# Patient Record
Sex: Male | Born: 2000 | Race: Black or African American | Hispanic: No | Marital: Single | Smoking: Never smoker
Health system: Southern US, Community
[De-identification: ages and names within clinical notes are randomized; demographics above are authoritative.]

## PROBLEM LIST (undated history)

## (undated) DIAGNOSIS — IMO0001 Reserved for inherently not codable concepts without codable children: Secondary | ICD-10-CM

## (undated) DIAGNOSIS — J45909 Unspecified asthma, uncomplicated: Secondary | ICD-10-CM

## (undated) DIAGNOSIS — D573 Sickle-cell trait: Secondary | ICD-10-CM

## (undated) DIAGNOSIS — Z464 Encounter for fitting and adjustment of orthodontic device: Secondary | ICD-10-CM

## (undated) HISTORY — PX: NO PAST SURGERIES: SHX2092

---
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
## 2022-05-27 IMAGING — US US SCROTUM W/ DOPPLER COMPLETE
1 series · 13 of 25 positions shown · non-contrast
Comparison: None.

CLINICAL DATA: Right testicular pain x3 hours

EXAM:
SCROTAL ULTRASOUND
DOPPLER ULTRASOUND OF THE TESTICLES
TECHNIQUE: Complete ultrasound examination of the testicles, epididymis, and
other scrotal structures was performed. Color and spectral Doppler
ultrasound were also utilized to evaluate blood flow to the
testicles.

[Series 1: us scrotum w/doppler · 54 acquisitions, 13 frames shown]
[im 1/54]
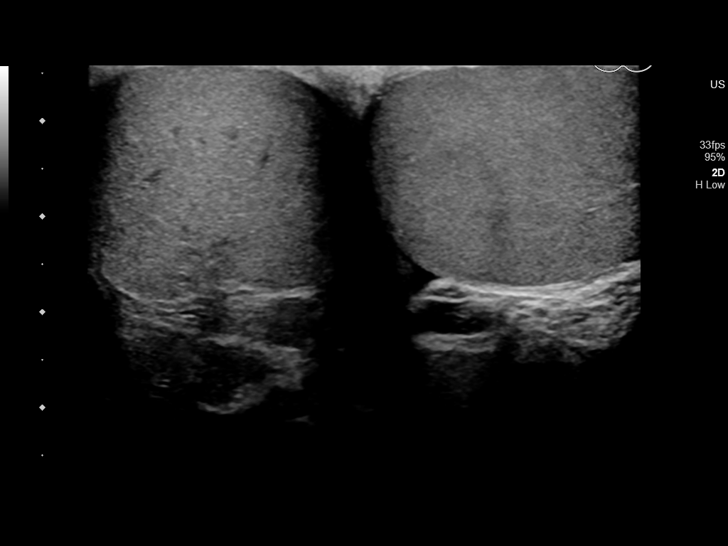
[im 5/54]
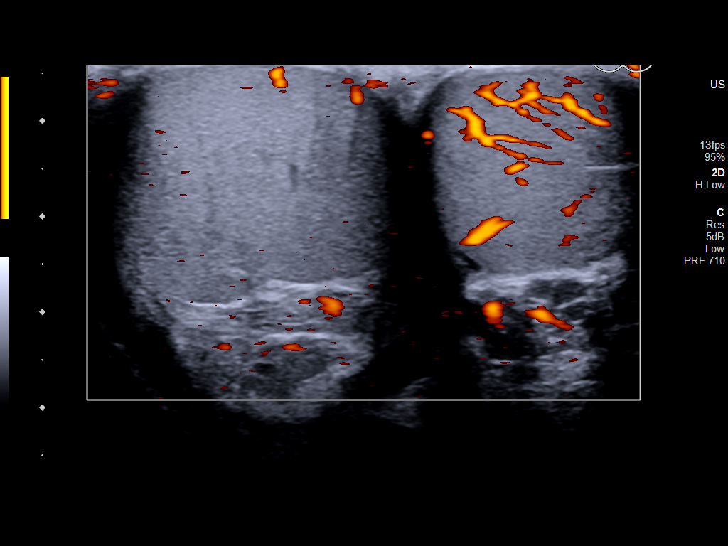
[im 9/54]
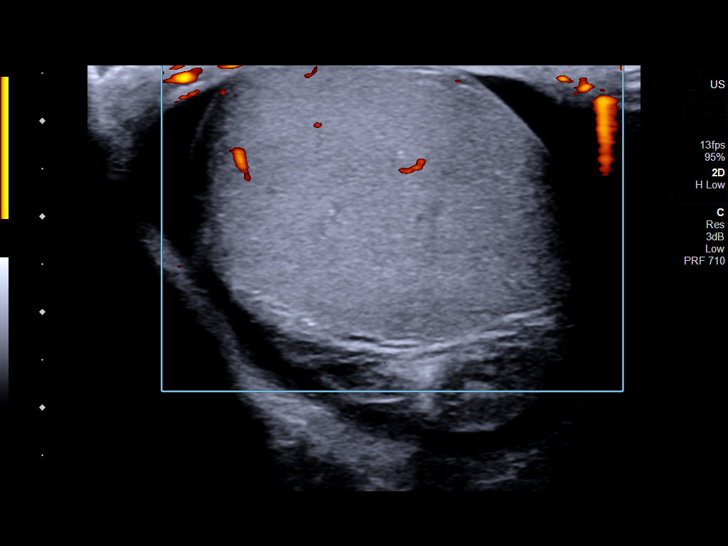
[im 14/54]
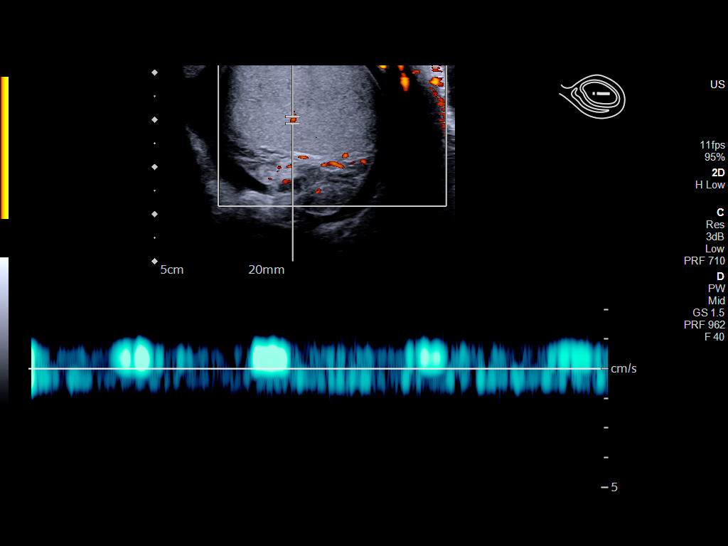
[im 18/54]
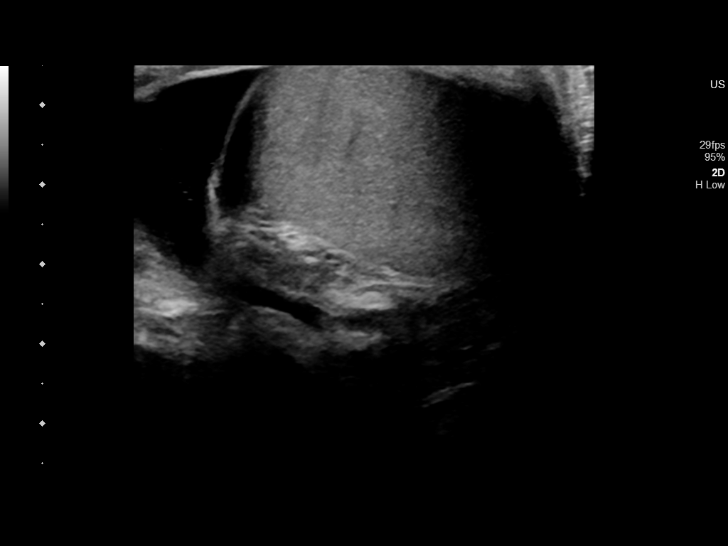
[im 23/54]
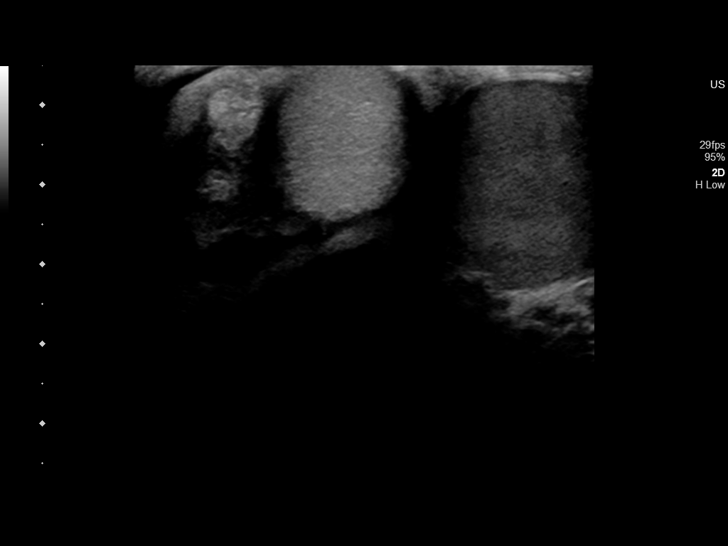
[im 27/54]
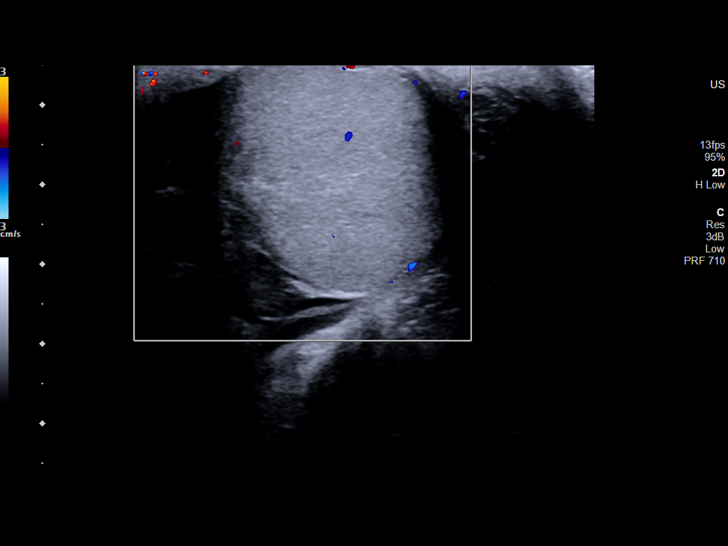
[im 31/54]
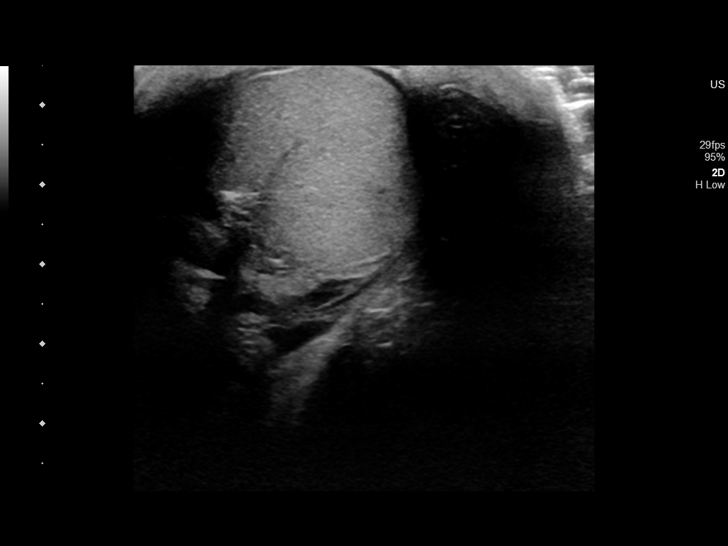
[im 36/54]
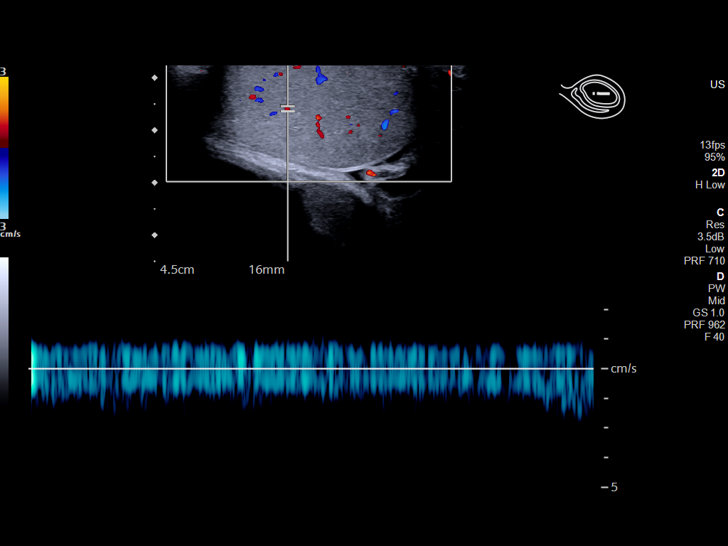
[im 40/54]
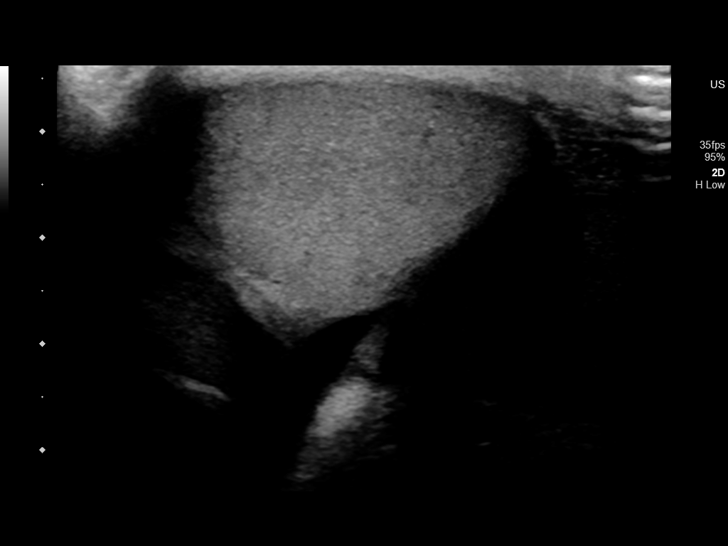
[im 45/54]
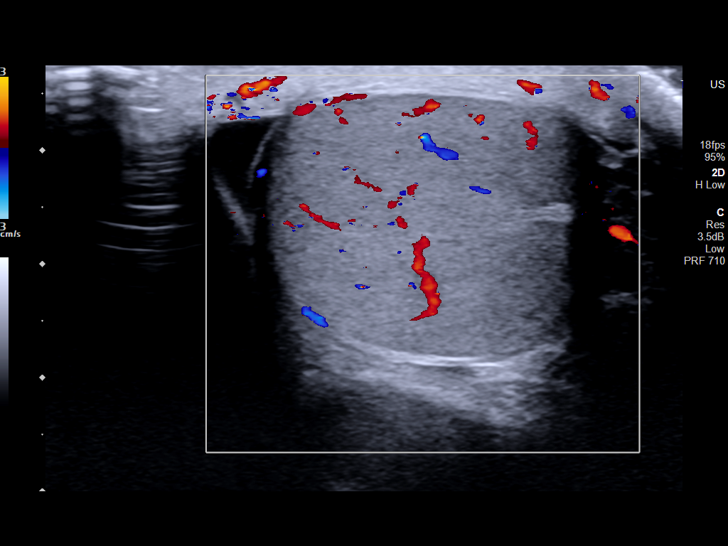
[im 49/54]
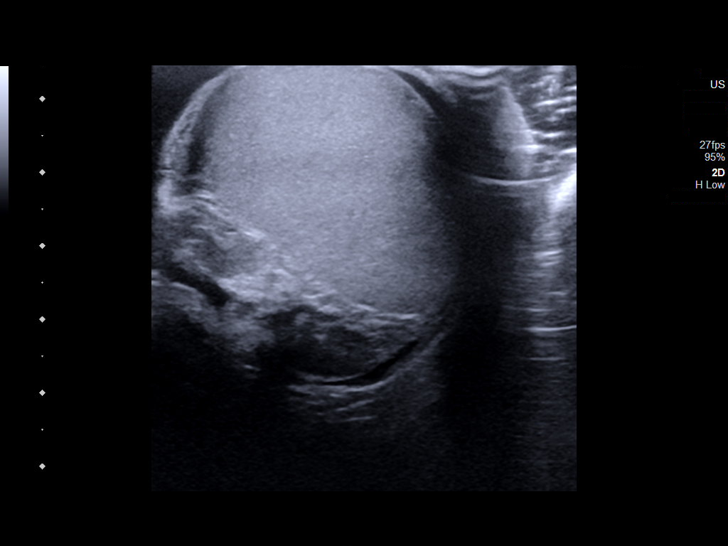
[im 54/54]
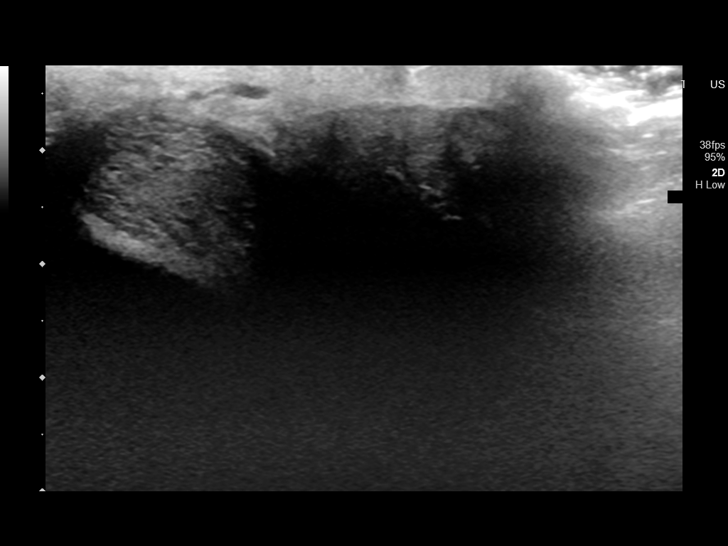

[13 of 25 positions shown; findings below may reference images not displayed]

FINDINGS: Right testicle

Measurements: 3.8 x 2.4 x 2.8 cm. No mass or microlithiasis
visualized. There is decreased color flow Doppler vascularity of the
right testicle. Normal parenchymal echogenicity and echotexture.

Left testicle

Measurements: 4.0 x 2.1 x 3.0 cm. No mass or microlithiasis
visualized.

Right epididymis: The right epididymis is enlarged and hypovascular.
No intrinsic mass lesion is identified.

Left epididymis:  Normal in size and appearance.

Hydrocele:  Small right hydrocele is present.

Varicocele:  None visualized.

Pulsed Doppler interrogation of both testes demonstrates there is
normal low resistance arterial and venous vascularity of the left
testicle. The right testicle demonstrates in absence of central
venous vascularity and markedly diminished arterial flow
IMPRESSION: Right testicular torsion. Enlargement of the right epididymis likely
relates to engorgement. Small reactive right hydrocele.

These results were called by telephone at the time of interpretation
on 06/01/2020 at [DATE] to provider ANTHI SATKA , who verbally
acknowledged these results.
